# Patient Record
Sex: Female | Born: 1935 | Race: White | Hispanic: No
Health system: Southern US, Community
[De-identification: ages and names within clinical notes are randomized; demographics above are authoritative.]

---

## 2005-04-12 ENCOUNTER — Ambulatory Visit: Payer: Self-pay | Admitting: Family Medicine

## 2005-05-18 ENCOUNTER — Ambulatory Visit: Payer: Self-pay | Admitting: Pain Medicine

## 2005-06-09 ENCOUNTER — Encounter: Payer: Self-pay | Admitting: Pain Medicine

## 2005-06-13 ENCOUNTER — Ambulatory Visit: Payer: Self-pay | Admitting: Pain Medicine

## 2005-07-01 ENCOUNTER — Encounter: Payer: Self-pay | Admitting: Pain Medicine

## 2005-08-25 ENCOUNTER — Ambulatory Visit: Payer: Self-pay | Admitting: Pain Medicine

## 2005-09-07 ENCOUNTER — Ambulatory Visit: Payer: Self-pay | Admitting: Pain Medicine

## 2005-10-27 ENCOUNTER — Ambulatory Visit: Payer: Self-pay | Admitting: Pain Medicine

## 2005-12-07 ENCOUNTER — Ambulatory Visit: Payer: Self-pay | Admitting: Pain Medicine

## 2006-01-10 ENCOUNTER — Ambulatory Visit: Payer: Self-pay | Admitting: Pain Medicine

## 2006-03-14 ENCOUNTER — Ambulatory Visit: Payer: Self-pay | Admitting: Pain Medicine

## 2006-04-17 ENCOUNTER — Ambulatory Visit: Payer: Self-pay | Admitting: Family Medicine

## 2006-09-18 ENCOUNTER — Ambulatory Visit: Payer: Self-pay | Admitting: Pain Medicine

## 2006-10-10 ENCOUNTER — Ambulatory Visit: Payer: Self-pay | Admitting: Pain Medicine

## 2006-10-18 ENCOUNTER — Ambulatory Visit: Payer: Self-pay | Admitting: Pain Medicine

## 2006-12-05 ENCOUNTER — Ambulatory Visit: Payer: Self-pay | Admitting: Pain Medicine

## 2006-12-18 ENCOUNTER — Ambulatory Visit: Payer: Self-pay | Admitting: Pain Medicine

## 2007-04-24 ENCOUNTER — Ambulatory Visit: Payer: Self-pay | Admitting: Pain Medicine

## 2007-05-09 ENCOUNTER — Ambulatory Visit: Payer: Self-pay | Admitting: Pain Medicine

## 2007-07-26 ENCOUNTER — Ambulatory Visit: Payer: Self-pay | Admitting: Pain Medicine

## 2007-08-20 ENCOUNTER — Ambulatory Visit: Payer: Self-pay | Admitting: Pain Medicine

## 2007-10-29 ENCOUNTER — Encounter: Payer: Self-pay | Admitting: Unknown Physician Specialty

## 2007-11-01 ENCOUNTER — Encounter: Payer: Self-pay | Admitting: Unknown Physician Specialty

## 2007-12-02 ENCOUNTER — Encounter: Payer: Self-pay | Admitting: Unknown Physician Specialty

## 2007-12-30 ENCOUNTER — Encounter: Payer: Self-pay | Admitting: Unknown Physician Specialty

## 2008-01-30 ENCOUNTER — Encounter: Payer: Self-pay | Admitting: Unknown Physician Specialty

## 2008-02-28 ENCOUNTER — Ambulatory Visit: Payer: Self-pay | Admitting: Unknown Physician Specialty

## 2008-03-18 ENCOUNTER — Other Ambulatory Visit: Payer: Self-pay

## 2008-03-18 ENCOUNTER — Inpatient Hospital Stay: Payer: Self-pay | Admitting: Internal Medicine

## 2008-03-22 ENCOUNTER — Encounter: Payer: Self-pay | Admitting: Internal Medicine

## 2008-03-31 ENCOUNTER — Encounter: Payer: Self-pay | Admitting: Internal Medicine

## 2008-04-30 ENCOUNTER — Encounter: Payer: Self-pay | Admitting: Internal Medicine

## 2011-07-27 ENCOUNTER — Ambulatory Visit: Payer: Self-pay | Admitting: Family Medicine

## 2011-11-15 ENCOUNTER — Encounter: Payer: Self-pay | Admitting: Nurse Practitioner

## 2011-11-15 ENCOUNTER — Encounter: Payer: Self-pay | Admitting: Cardiothoracic Surgery

## 2011-12-02 ENCOUNTER — Ambulatory Visit: Payer: Self-pay | Admitting: Internal Medicine

## 2011-12-02 ENCOUNTER — Encounter: Payer: Self-pay | Admitting: Nurse Practitioner

## 2011-12-02 ENCOUNTER — Encounter: Payer: Self-pay | Admitting: Cardiothoracic Surgery

## 2011-12-06 ENCOUNTER — Inpatient Hospital Stay: Payer: Self-pay | Admitting: Internal Medicine

## 2011-12-06 LAB — COMPREHENSIVE METABOLIC PANEL
Albumin: 2.4 g/dL — ABNORMAL LOW (ref 3.4–5.0)
Alkaline Phosphatase: 107 U/L (ref 50–136)
Anion Gap: 12 (ref 7–16)
BUN: 37 mg/dL — ABNORMAL HIGH (ref 7–18)
Bilirubin,Total: 0.6 mg/dL (ref 0.2–1.0)
Glucose: 82 mg/dL (ref 65–99)
Potassium: 5.1 mmol/L (ref 3.5–5.1)
SGOT(AST): 45 U/L — ABNORMAL HIGH (ref 15–37)
SGPT (ALT): 7 U/L — ABNORMAL LOW
Sodium: 135 mmol/L — ABNORMAL LOW (ref 136–145)
Total Protein: 6.5 g/dL (ref 6.4–8.2)

## 2011-12-06 LAB — URINALYSIS, COMPLETE
Bilirubin,UR: NEGATIVE
Hyaline Cast: 8
Nitrite: NEGATIVE
Ph: 5 (ref 4.5–8.0)
Protein: NEGATIVE
RBC,UR: 123 /HPF (ref 0–5)
Squamous Epithelial: 8
Transitional Epi: <1

## 2011-12-06 LAB — CBC
HCT: 38.6 % (ref 35.0–47.0)
HGB: 12.6 g/dL (ref 12.0–16.0)
MCV: 96 fL (ref 80–100)
RBC: 4.03 10*6/uL (ref 3.80–5.20)
RDW: 18.1 % — ABNORMAL HIGH (ref 11.5–14.5)
WBC: 18.5 10*3/uL — ABNORMAL HIGH (ref 3.6–11.0)

## 2011-12-07 LAB — CBC WITH DIFFERENTIAL/PLATELET
Basophil #: 0 10*3/uL (ref 0.0–0.1)
Eosinophil #: 0.1 10*3/uL (ref 0.0–0.7)
Eosinophil %: 0.6 %
Lymphocyte %: 8.6 %
MCH: 31.5 pg (ref 26.0–34.0)
MCHC: 32.6 g/dL (ref 32.0–36.0)
Monocyte #: 0.5 10*3/uL (ref 0.0–0.7)
Neutrophil %: 85.4 %
Platelet: 188 10*3/uL (ref 150–440)
RBC: 3.25 10*6/uL — ABNORMAL LOW (ref 3.80–5.20)
RDW: 18.2 % — ABNORMAL HIGH (ref 11.5–14.5)

## 2011-12-07 LAB — BASIC METABOLIC PANEL
BUN: 28 mg/dL — ABNORMAL HIGH (ref 7–18)
Calcium, Total: 7.5 mg/dL — ABNORMAL LOW (ref 8.5–10.1)
Chloride: 105 mmol/L (ref 98–107)
Creatinine: 1.32 mg/dL — ABNORMAL HIGH (ref 0.60–1.30)
EGFR (African American): 50 — ABNORMAL LOW
EGFR (Non-African Amer.): 41 — ABNORMAL LOW
Glucose: 71 mg/dL (ref 65–99)
Osmolality: 278 (ref 275–301)
Potassium: 4.6 mmol/L (ref 3.5–5.1)

## 2011-12-07 LAB — MAGNESIUM: Magnesium: 2.1 mg/dL

## 2011-12-08 LAB — CBC WITH DIFFERENTIAL/PLATELET
Basophil #: 0 10*3/uL (ref 0.0–0.1)
Eosinophil #: 0.1 10*3/uL (ref 0.0–0.7)
Lymphocyte #: 1.1 10*3/uL (ref 1.0–3.6)
Lymphocyte %: 14.4 %
MCHC: 32.5 g/dL (ref 32.0–36.0)
Monocyte %: 5.5 %
Neutrophil %: 78.2 %
RBC: 3.14 10*6/uL — ABNORMAL LOW (ref 3.80–5.20)
RDW: 18 % — ABNORMAL HIGH (ref 11.5–14.5)
WBC: 7.8 10*3/uL (ref 3.6–11.0)

## 2011-12-08 LAB — CREATININE, SERUM
Creatinine: 1.29 mg/dL (ref 0.60–1.30)
EGFR (African American): 52 — ABNORMAL LOW

## 2011-12-09 LAB — CBC WITH DIFFERENTIAL/PLATELET
Eosinophil %: 1.3 %
HCT: 31.2 % — ABNORMAL LOW (ref 35.0–47.0)
Lymphocyte %: 13.4 %
MCH: 31.1 pg (ref 26.0–34.0)
Monocyte #: 0.6 10*3/uL (ref 0.0–0.7)
Monocyte %: 6.3 %
Neutrophil #: 6.8 10*3/uL — ABNORMAL HIGH (ref 1.4–6.5)
Neutrophil %: 78.8 %
Platelet: 197 10*3/uL (ref 150–440)
RBC: 3.23 10*6/uL — ABNORMAL LOW (ref 3.80–5.20)

## 2011-12-09 LAB — BASIC METABOLIC PANEL
Anion Gap: 10 (ref 7–16)
BUN: 13 mg/dL (ref 7–18)
EGFR (Non-African Amer.): 58 — ABNORMAL LOW
Glucose: 114 mg/dL — ABNORMAL HIGH (ref 65–99)
Osmolality: 280 (ref 275–301)
Potassium: 3.9 mmol/L (ref 3.5–5.1)

## 2011-12-12 LAB — CREATININE, SERUM
Creatinine: 0.82 mg/dL (ref 0.60–1.30)
EGFR (African American): >60
EGFR (Non-African Amer.): >60

## 2011-12-12 LAB — CULTURE, BLOOD (SINGLE)

## 2011-12-30 ENCOUNTER — Encounter: Payer: Self-pay | Admitting: Cardiothoracic Surgery

## 2011-12-30 ENCOUNTER — Encounter: Payer: Self-pay | Admitting: Nurse Practitioner

## 2011-12-30 ENCOUNTER — Ambulatory Visit: Payer: Self-pay | Admitting: Internal Medicine

## 2012-01-30 ENCOUNTER — Encounter: Payer: Self-pay | Admitting: Cardiothoracic Surgery

## 2012-04-20 ENCOUNTER — Ambulatory Visit: Payer: Self-pay | Admitting: Neurology

## 2012-04-30 ENCOUNTER — Ambulatory Visit: Payer: Self-pay | Admitting: Internal Medicine

## 2012-05-18 ENCOUNTER — Inpatient Hospital Stay: Payer: Self-pay | Admitting: Internal Medicine

## 2012-05-18 LAB — COMPREHENSIVE METABOLIC PANEL
Albumin: 2.2 g/dL — ABNORMAL LOW (ref 3.4–5.0)
Alkaline Phosphatase: 129 U/L (ref 50–136)
Anion Gap: 11 (ref 7–16)
BUN: 57 mg/dL — ABNORMAL HIGH (ref 7–18)
Chloride: 110 mmol/L — ABNORMAL HIGH (ref 98–107)
Creatinine: 1.35 mg/dL — ABNORMAL HIGH (ref 0.60–1.30)
Glucose: 95 mg/dL (ref 65–99)
Osmolality: 302 (ref 275–301)
Potassium: 4.1 mmol/L (ref 3.5–5.1)
SGOT(AST): 19 U/L (ref 15–37)
Sodium: 144 mmol/L (ref 136–145)
Total Protein: 6.9 g/dL (ref 6.4–8.2)

## 2012-05-18 LAB — CK TOTAL AND CKMB (NOT AT ARMC)
CK, Total: 25 U/L (ref 21–215)
CK-MB: <0.5 ng/mL — ABNORMAL LOW (ref 0.5–3.6)

## 2012-05-18 LAB — URINALYSIS, COMPLETE
Bilirubin,UR: NEGATIVE
Hyaline Cast: 102
Nitrite: NEGATIVE
Ph: 5 (ref 4.5–8.0)
Protein: 100
Specific Gravity: 1.023 (ref 1.003–1.030)

## 2012-05-18 LAB — CBC
MCHC: 30.4 g/dL — ABNORMAL LOW (ref 32.0–36.0)
Platelet: 138 10*3/uL — ABNORMAL LOW (ref 150–440)
RBC: 3.7 10*6/uL — ABNORMAL LOW (ref 3.80–5.20)
WBC: 20.9 10*3/uL — ABNORMAL HIGH (ref 3.6–11.0)

## 2012-05-18 LAB — PRO B NATRIURETIC PEPTIDE: B-Type Natriuretic Peptide: 5000 pg/mL — ABNORMAL HIGH (ref 0–450)

## 2012-05-18 LAB — TROPONIN I: Troponin-I: 0.08 ng/mL — ABNORMAL HIGH

## 2012-05-19 LAB — CBC WITH DIFFERENTIAL/PLATELET
Basophil #: 0 10*3/uL (ref 0.0–0.1)
Eosinophil #: 0 10*3/uL (ref 0.0–0.7)
Eosinophil %: 0 %
HGB: 10 g/dL — ABNORMAL LOW (ref 12.0–16.0)
Lymphocyte #: 0.7 10*3/uL — ABNORMAL LOW (ref 1.0–3.6)
MCHC: 30.1 g/dL — ABNORMAL LOW (ref 32.0–36.0)
MCV: 87 fL (ref 80–100)
Monocyte #: 0.1 x10 3/mm — ABNORMAL LOW (ref 0.2–0.9)
Monocyte %: 0.7 %
Neutrophil %: 95 %
Platelet: 114 10*3/uL — ABNORMAL LOW (ref 150–440)
RBC: 3.81 10*6/uL (ref 3.80–5.20)
RDW: 23 % — ABNORMAL HIGH (ref 11.5–14.5)

## 2012-05-19 LAB — COMPREHENSIVE METABOLIC PANEL
Alkaline Phosphatase: 103 U/L (ref 50–136)
Anion Gap: 13 (ref 7–16)
Calcium, Total: 7.7 mg/dL — ABNORMAL LOW (ref 8.5–10.1)
Chloride: 113 mmol/L — ABNORMAL HIGH (ref 98–107)
Co2: 22 mmol/L (ref 21–32)
Creatinine: 0.97 mg/dL (ref 0.60–1.30)
EGFR (Non-African Amer.): 56 — ABNORMAL LOW
Potassium: 3 mmol/L — ABNORMAL LOW (ref 3.5–5.1)
SGOT(AST): 13 U/L — ABNORMAL LOW (ref 15–37)
SGPT (ALT): <6 U/L — ABNORMAL LOW

## 2012-05-19 LAB — TROPONIN I: Troponin-I: <0.02 ng/mL

## 2012-05-19 LAB — MAGNESIUM: Magnesium: 2.4 mg/dL

## 2012-05-21 LAB — URINE CULTURE

## 2012-05-24 LAB — CULTURE, BLOOD (SINGLE)

## 2012-05-31 ENCOUNTER — Ambulatory Visit: Payer: Self-pay | Admitting: Internal Medicine

## 2012-07-01 DEATH — deceased

## 2014-06-05 IMAGING — CT CT HEAD WITHOUT CONTRAST
4 of 9 series · 8 of 30 positions shown, 9 images · non-contrast
Comparison: none

REASON FOR EXAM: advanced dementia
COMMENTS:

[Series 6: soft tissue · axial · 0.43mm/px · z∈[-49,+2]mm · 2 of 34 slices shown, 3 images (1 of 2)]
[im 12/34  brain]
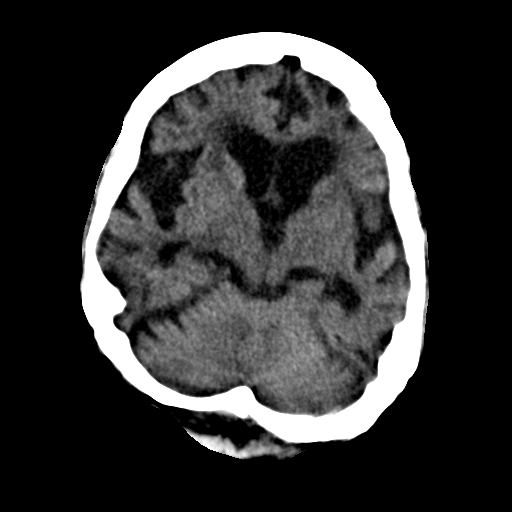
[im 12/34  bone]
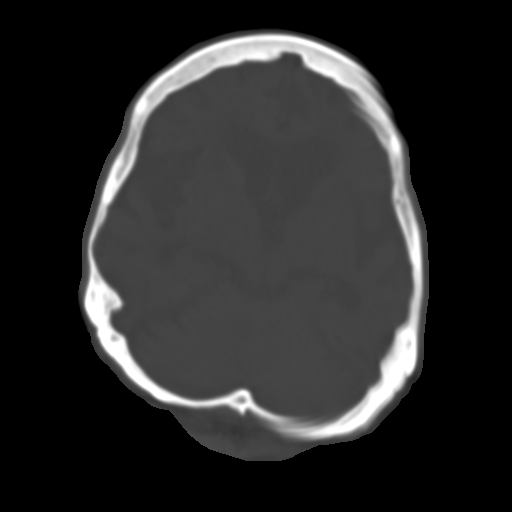
[im 23/34  brain]
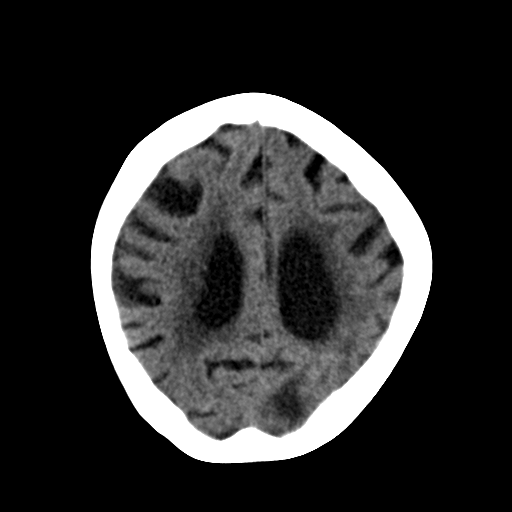

[Series 7: bone · axial · 0.43mm/px · z∈[-46,+6]mm · 2 of 34 slices shown (1 of 2)]
[im 12/34  bone]
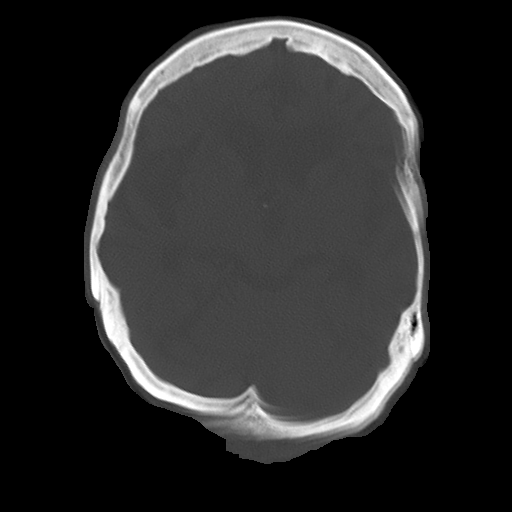
[im 23/34  bone]
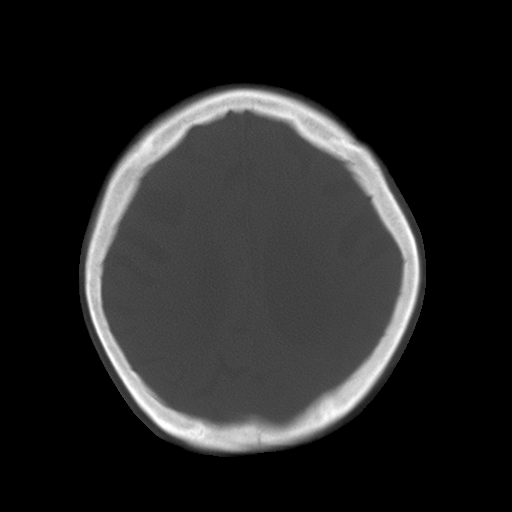

[Series 10: soft tissue · axial · 0.39mm/px · z∈[+8,+58]mm · 2 of 32 slices shown (2 of 2)]
[im 11/32  brain]
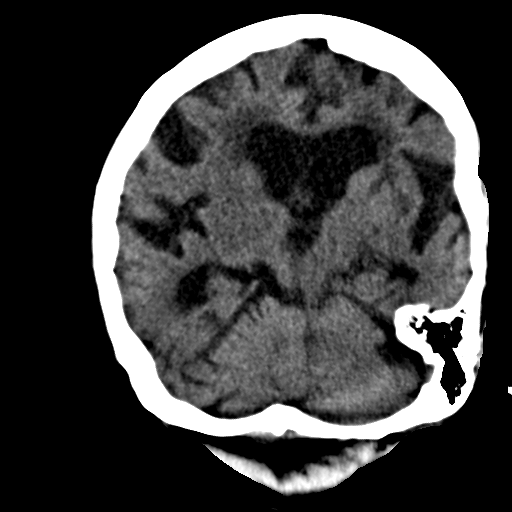
[im 21/32  brain]
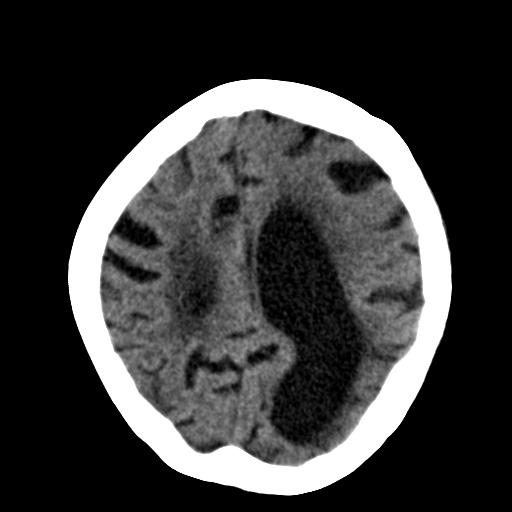

[Series 11: bone · axial · 0.39mm/px · z∈[+9,+59]mm · 2 of 32 slices shown (2 of 2)]
[im 11/32  bone]
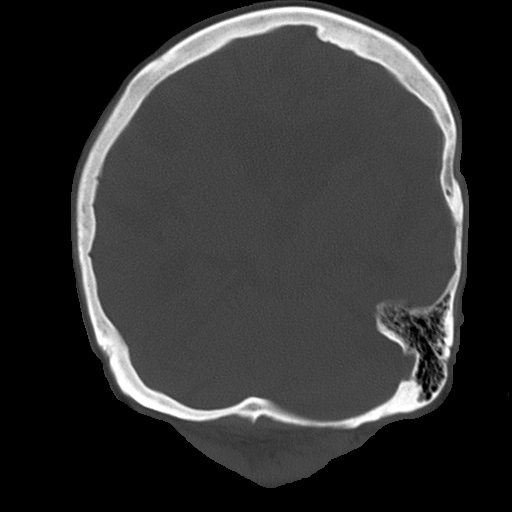
[im 21/32  bone]
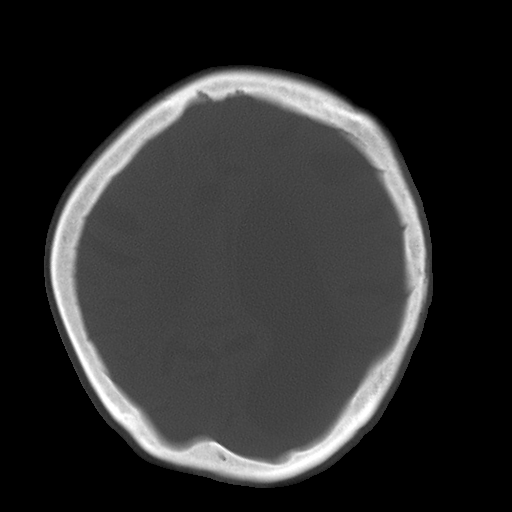

[8 of 30 positions shown; findings below may reference images not displayed]

PROCEDURE:     CT  - CT HEAD WITHOUT CONTRAST  - April 20, 2012  [DATE]

RESULT:     Images through the brain were acquired with reconstructions
performed to bring the patient into near-anatomic position.

There is diffuse cerebral and cerebellar atrophy with moderate compensatory
ventriculomegaly. There is no shift of the midline. There is no acute
intracranial hemorrhage. There are no findings to suggest an evolving
ischemic event. Decreased density in the deep white matter of both cerebral
hemispheres is consistent with chronic small vessel ischemia. At bone window
settings I do not see evidence of an acute skull fracture.
IMPRESSION: There are extensive chronic changes consistent with atrophy
and ventriculomegaly and chronic small vessel ischemia. There is no acute
ischemic or hemorrhagic event nor intracranial mass effect.

## 2014-07-03 IMAGING — CR DG CHEST 1V PORT
1 series · 1 of 1 positions shown · non-contrast
Comparison: none

REASON FOR EXAM: Shortness of Breath
COMMENTS:

[portable]
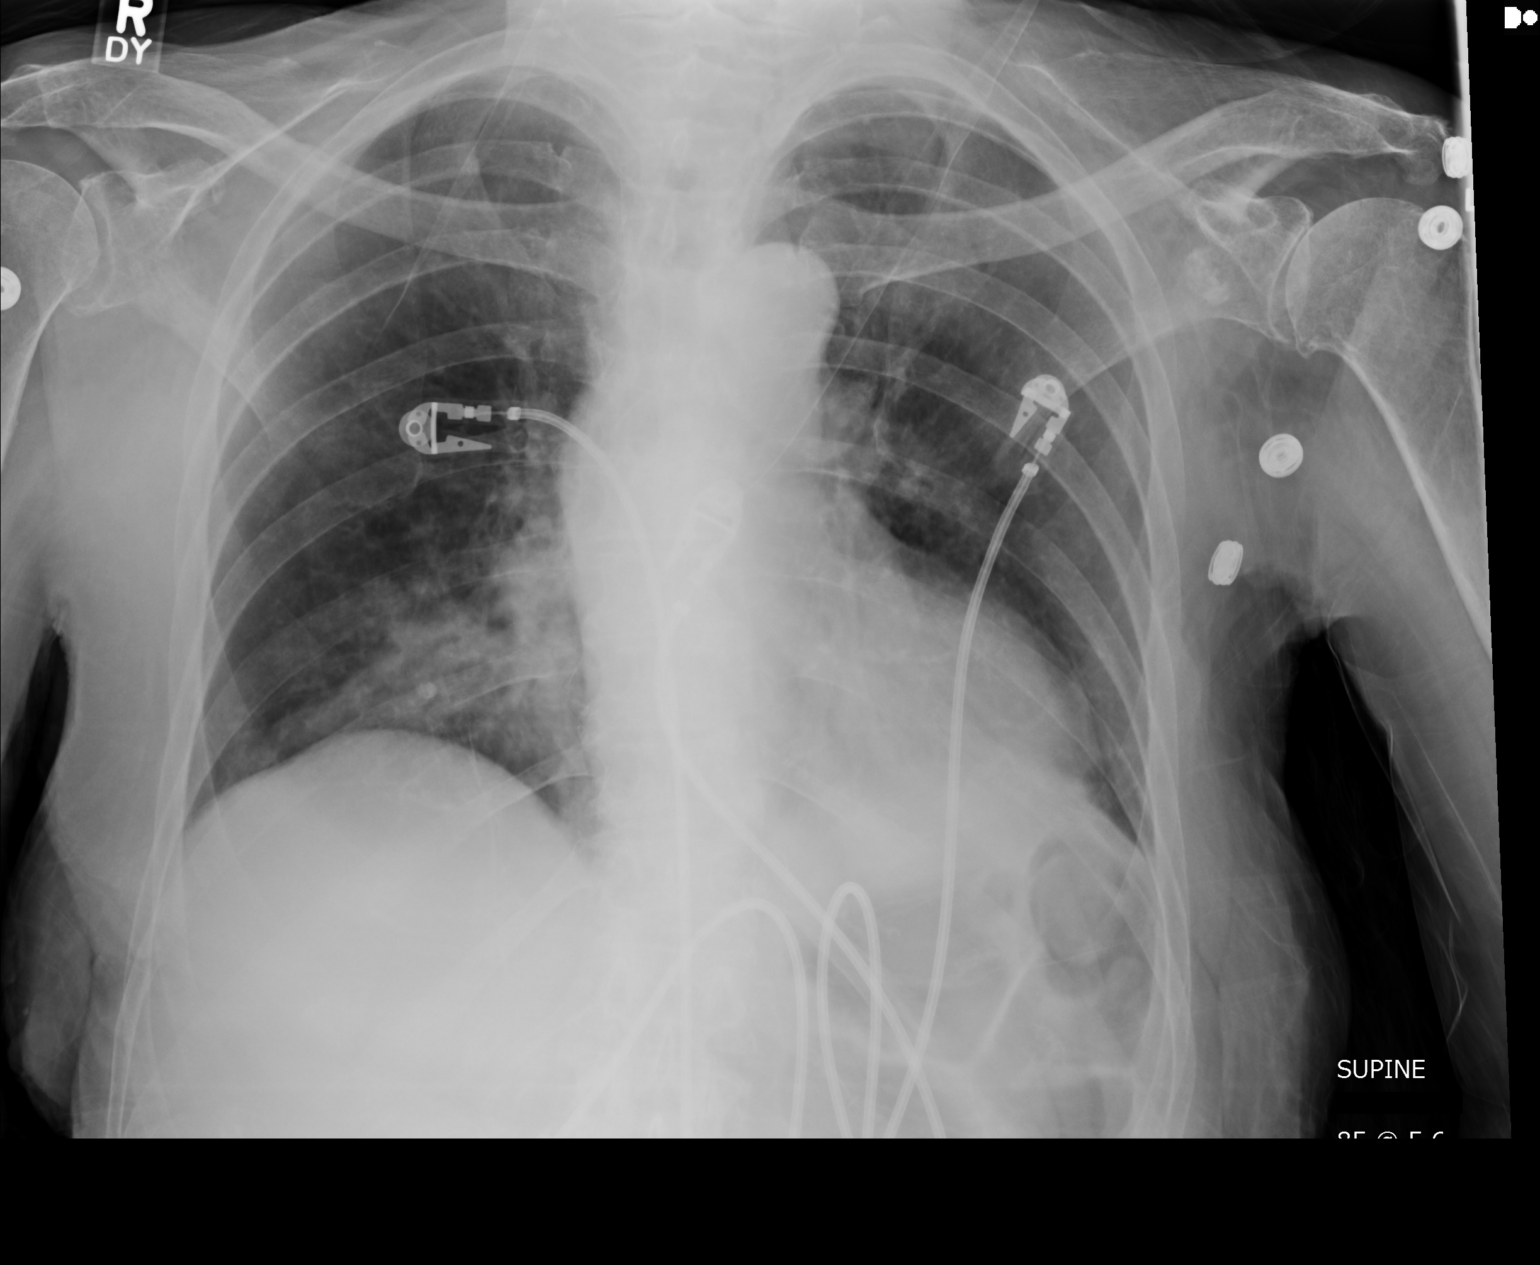

[1 of 1 positions shown; findings below may reference images not displayed]

PROCEDURE:     DXR - DXR PORTABLE CHEST SINGLE VIEW  - May 18, 2012  [DATE]

RESULT:     Comparison is made to study 06 December, 2011.

There is a new infiltrate in the right infrahilar region. The left
hemidiaphragm remains partially obscured. The cardiac silhouette is not
enlarged. The pulmonary vascularity is not engorged.
IMPRESSION: The findings suggest right lower lobe pneumonia. A followup
PA and lateral chest x-ray would be of value.

## 2015-02-17 NOTE — Discharge Summary (Signed)
PATIENT NAME:  Dawn Long, Dawn Long MR#:  [id number] DATE OF BIRTH:  01-18-1932  DATE OF ADMISSION:  05/18/2012 DATE OF DISCHARGE:  05/21/2012  PRIMARY CARE PHYSICIAN: Karrie DoffingWilliam Selvidge, MD  DISCHARGE DIAGNOSES:  1. Aspiration pneumonia.  2. Culture-negative urinary tract infection.  3. Sepsis. 4. Acute respiratory failure.  5. Acute encephalopathy. 6. Dysphagia. 7. Elevated troponin.  8. Atrial fibrillation with rapid ventricular rate.  9. Oral thrush.  10. Multiple decubitus ulcers present on admission.   IMAGING STUDIES: Chest x-ray showed right lower lobe pneumonia.   CONSULTANTS: Ned GraceNancy Phifer, MD - Palliative Care.   ADMITTING HISTORY AND PHYSICAL: Please see detailed history and physical dictated on 05/18/2012. In brief, this is a 81 year old female patient with advanced dementia, bedridden with multiple decubitus ulcers and recurrent UTIs at home on hospice care who presented to the Emergency Room for shortness of breath, brought in by her daughter. The patient was DNR/DNI, but the daughter did want aggressive treatment of the infection in spite of being on hospice and was admitted to the hospitalist service.   HOSPITAL COURSE: The patient was started on antibiotics of vancomycin and Zosyn along with azithromycin for coverage of her recurrent urinary tract infections and aspiration pneumonia with sepsis and acute respiratory failure. The patient responded poorly to antibiotic treatment, IV fluids, and aggressive management. Dr. Harvie JuniorPhifer of palliative care was consulted who suggested returning home on hospice, but the patient's daughter continued to want aggressive treatment. Later with multiple comorbidities, the patient did poorly during the hospital and was awake, in the patient's own words wanted to spend time with her family and go home, and at that time the decision was made by her daughter to take the patient home on hospice without any aggressive measures. At this point, the patient's  antibiotics were stopped. The patient was started on pain and anxiety medications along with atropine drops to decrease her secretions and is being discharged home with hospice.   This plan was discussed with the patient and her daughter who verbalized understanding and are okay with the plan. Today the patient is awake, needing 3 liters of oxygen to keep her saturations at 93%, is afebrile, lungs have coarse wheezing, and is being discharged home on hospice.   DISCHARGE MEDICATIONS:  1. Nystatin 100,000 unit topical application twice a day.  2. Metronidazole 250 mg oral tablet 1-1/2 tablets apply to wounds.  3. Acetaminophen 650 mg rectal suppository four times a day as needed.  4. Lorazepam 2 mg/mL 1 mL every six hours as needed for anxiety.  5. Oxycodone 20 mg/mL oral concentrate 0.5 mL every six hours as needed for pain.  6. Atropine 1% ophthalmic solution two drops oral every four hours as needed for secretions.   DISCHARGE INSTRUCTIONS: The patient will be discharged home on hospice. Continue medications as prescribed. The patient is at high risk for recurrent pneumonias and infections and UTIs and this was explained to the daughter. Further management as per hospice team.   TIME SPENT: Time spent today on this discharge dictation along with coordinating care and counseling of the patient was 35 minutes. ____________________________ Molinda BailiffSrikar R. Darriona Dehaas, MD srs:slb D: 05/21/2012 16:12:54 ET T: 05/22/2012 12:43:06 ET JOB#: [id number]  cc: Wardell HeathSrikar R. Elpidio AnisSudini, MD, <Dictator> Karrie DoffingWilliam Selvidge, MD Orie FishermanSRIKAR R Makyah Lavigne MD ELECTRONICALLY SIGNED 05/30/2012 13:51

## 2015-02-17 NOTE — H&P (Signed)
PATIENT NAME:  Dawn Long, Dawn Long MR#:  [id number] DATE OF BIRTH:  18-Jun-1932  DATE OF ADMISSION:  05/18/2012  REFERRING PHYSICIAN: Dr. Mindi JunkerGottlieb  PRIMARY CARE PHYSICIAN: Dr. Karrie DoffingWilliam Selvidge   CHIEF COMPLAINT: Shortness of breath.   HISTORY OF PRESENT ILLNESS: This is a 81 year old female with advanced dementia, bedridden, multiple decubitus ulcer over the last year, lives with her daughter at home on hospice care presents for shortness of breath. Daughter reports that her mother appeared to have difficulty breathing which prompted her to bring her to the Emergency Department. As well daughter reports patient has not been eating or drinking well for some time now and mainly over the last few days as well and she reported she had difficulty swallowing as well secondary to oral thrush. In ED patient was found to be hypoxic requiring 100% rebreather bag to maintain saturation in the mid 90s. Had chest x-ray done which did show right lung infiltrate. Daughter reports patient upon eating has been coughing. Patient had significant leukocytosis of 20,000 and was found to be in atrial fibrillation with RVR upon presentation to ED.   As well patient is complaining of recurrent urinary tract infection, has chronic indwelling Foley, has been treated previously with amoxicillin without much improvement where she was recently switched to Bactrim. As well patient has been having multiple decubitus ulcers where she has been followed at home with home care for that and with wound clinic as well.   CODE STATUS: DO NOT RESUSCITATE, DO NOT INTUBATE. Discussed at length with the daughter about goals of care and she reports patient has a living will which explains her wishes not to be kept alive on any artificial means. Patient has expressed previously her refusal for any form of tube feed.   Patient is demented and most of the history was obtained from daughter.   PAST MEDICAL HISTORY:  1. 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   2. Fibromyalgia.  3. Chronic lower back pain.  4. History of severe arthritis.  5. Decubitus ulcers, multiple, in different stages.  6. Hypertension.  7. Migraine headache.  8. History of right knee replacement.   ALLERGIES: Amitriptyline.   HOME MEDICATIONS:  1. Clotrimazole 10 mg oral lozenges 5 times a day for seven days.  2. Metronidazole 250 mg oral 1-1/2 tablets applied to wound.  3. Nystatin topical powder apply to affected area twice a day.  4. Bactrim double strength oral 2 times a day for a total seven days.   REVIEW OF SYSTEMS: Unobtainable as the patient has significant dementia and does not talk much.   FAMILY HISTORY: Positive for hypertension.   SOCIAL HISTORY: Patient lives with her daughter who takes care of her. Nonsmoker. Nondrinker. 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   PHYSICAL EXAMINATION:  VITAL SIGNS: Temperature 100, pulse 110, respiratory rate 20, blood pressure 113/71, saturating 90% on nonrebreather bag.   GENERAL: Frail, elderly female looks comfortable in bed in no apparent distress.   HEENT: Head atraumatic, normocephalic. Dry oral mucosa. Has poor oral hygiene with minimal thrush.   NECK: Supple. No thyromegaly. No JVD. No bruits.   CHEST: Patient had fair air entry bilaterally with right lung rales.   CARDIOVASCULAR: S1, S2 heard. Irregular, irregular tachycardia. No rubs, gallops.   ABDOMEN: Soft, nontender, nondistended.   EXTREMITIES: No edema.   SKIN: Patient has multiple pressure ulcers mainly in the left elbow area and significant stage IV sacral ulcer which is packed, does not  appear to be toxic. As well right trochanter area and right gluteal area and bilateral heel ulcers. As well in her back appears to be multiple abrasions with pressure ulcers.   PSYCHIATRIC: Unable to evaluate as patient has advanced dementia.   NEUROLOGIC: Unable to evaluate as well, secondary to advanced dementia.    LABORATORY, DIAGNOSTIC AND RADIOLOGICAL DATA: ProBNP 5000, glucose 95, BUN 57, creatinine 1.35, sodium 144, potassium 4.1, chloride 110, CO2 23, troponin 0.08. White blood cells 20.9, hemoglobin 9.7, platelets 138, hematocrit 32. Chest x-ray showing right lower lung infiltrate.   EKG is showing atrial fibrillation with RVR heart rate of 161.   ASSESSMENT AND PLAN:  1. This is a 81 year old female with multiple medical problems on home hospice presents with acute respiratory failure secondary to pneumonia, appears to be most likely aspiration pneumonia secondary to dysphagia from patient's dementia.  2. Acute respiratory failure. This is secondary to pneumonia. Will continue on nonrebreather bag and will titrate oxygen gradually if improves. Patient is DO NOT RESUSCITATE/DO NOT INTUBATE.  3. Pneumonia, right lower lung pneumonia. This is most likely aspiration pneumonia secondary to patient's dysphagia. Patient will be kept n.p.o. Will be on broad-spectrum antibiotic, IV vancomycin and Zosyn. Blood cultures will be sent.  4. Dysphagia. Patient appears to have dysphagia plus odynophagia from oral thrush and her advanced dementia. Will have swallow service evaluate the patient, meanwhile she will be kept n.p.o.  5. Borderline troponin. Patient has mildly elevated troponin.  This is most likely related to her sepsis and acute renal failure and atrial fibrillation with RVR. Will repeat troponin in a.Long.  6. Atrial fibrillation with RVR. Patient's blood pressure is borderline, unable to tolerate any p.o. intake. Will have her on p.r.n. metoprolol.  7. Oral thrush secondary to patient's inability to tolerate any p.o. intake. Will have her on IV Diflucan x3 days and oral hygiene every two hours.  8. Chronic recurrent urinary tract infection. Patient has chronic indwelling Foley. Will continue with broad-spectrum IV antibiotics till blood culture and urine culture results are available.  9. Pressure ulcers.  Appears to be nontoxic. Will consult wound care.  10. Had lengthy discussion with daughter about goals of care and will consult palliative care consult to discuss end of life issues and goals of care with this patient. Overall has poor quality of life.  11. CODE STATUS: DO NOT RESUSCITATE/DO NOT INTUBATE.   TOTAL TIME SPENT ON PATIENT CARE: 60 minutes.   ____________________________ Starleen Arms, MD dse:cms D: 05/18/2012 15:33:29 ET T: 05/18/2012 16:02:57 ET JOB#: [id number]  cc: Starleen Arms, MD, <Dictator> Karrie Doffing, MD Joeanna Howdyshell Teena Irani MD ELECTRONICALLY SIGNED 05/19/2012 22:14

## 2015-02-22 NOTE — Consult Note (Signed)
PATIENT NAME:  Dawn Long, Fatime M MR#:  [id number] DATE OF BIRTH:  1932-04-27  DATE OF CONSULTATION:  12/06/2011  REFERRING PHYSICIAN:   CONSULTING PHYSICIAN:  Adah Salvageichard E. Excell Seltzerooper, MD  CHIEF COMPLAINT: Decubitus ulcer.   HISTORY OF PRESENT ILLNESS: I was called by Dr. Allena KatzPatel who I spoke to personally concerning this patient's decubitus. She is being admitted to the hospital with likely sepsis and I was asked to evaluate a nonhealing sacral decubitus ulcer and possibly a heel ulcer. She is being seen in the [facility]ound Care Clinic. This has been going on for several months and seems to be worsening in spite of aggressive therapy to prevent this.   The patient apparently has Parkinson's dementia and is severely debilitated. She has been bedbound and wheelchair bedbound now for several months.   PAST MEDICAL HISTORY:  1. Parkinson's dementia. 2. Fibromyalgia.  3. Arthritis.  4. Decubitus ulcer. 5. Right heel ulcer. 6. Hypertension.   PAST SURGICAL HISTORY: Knee replacement.   MEDICATIONS: Multiple, see chart.   FAMILY HISTORY: Noncontributory.   REVIEW OF SYSTEMS: Not obtainable.   PHYSICAL EXAMINATION:   GENERAL: Chronically ill, debilitated, and noncommunicative patient. She is seen in the Emergency Room.   VITAL SIGNS: No temperature taken in the Emergency Room but her pulse is 86, blood pressure 71/48, 86% room air sats.   HEENT: Poor dentition. No scleral icterus.   NECK: No palpable neck nodes.   CHEST: Bilateral rhonchi.   CARDIAC: Regular rate and rhythm.   ABDOMEN: Soft, nontender.   EXTREMITIES: Minimal edema. Lower extremities are wrapped in a heel protecting boot and the right heel ulcer is not examined.   The patient is rolled to inspect the sacral decubitus ulcer. There is a cephalad opening measuring approximately 4 x 4 cm. There is minimal to no erythema, no foul odor, and no drainage but the undermining extends well down to the sacrum and below.   LABORATORY,  DIAGNOSTIC, AND RADIOLOGICAL DATA: White blood cell count 18.5. Electrolytes show creatinine of 1.74, serum sodium of 135, calcium of 8.4, albumin of 2.4, total protein of 6.5. Urinalysis shows 3+ blood, leukocyte esterase 2+, RBCs and white blood cells both over 100 per high-power field with 2+ bacteria.   ASSESSMENT AND PLAN: This is a patient with a sacral decubitus and a right heel ulcer. This sacral decubitus needs to be examined under anesthesia and likely unroofed and debrided and at the same time an exam under anesthesia and possible debridement of the heel ulcer could be performed as well. However, the patient's blood pressure is significantly low. She likely has a urinary tract infection and is being treated with IV antibiotics. I do not believe that the source of her infection is the decubitus ulcer in that there is no foul odor and there is no expressible drainage but it certainly will not heal without further debridement and cleaning up this area.        My plan would be to allow Dr. Eliane DecreePatel's team to treat this patient with IV antibiotics. Once her blood pressure is sustainable and an operating room visit under general anesthesia could be made safely, then we could debride the sacral decubitus and possibly the heel ulcer. This was discussed with the daughter and she is in agreement.   ____________________________ Adah Salvageichard E. Excell Seltzerooper, MD rec:drc D: 12/07/2011 08:58:01 ET T: 12/07/2011 09:47:55 ET JOB#: [id number]  cc: Adah Salvageichard E. Excell Seltzerooper, MD, <Dictator> Lattie HawICHARD E Kaliegh Willadsen MD ELECTRONICALLY SIGNED 12/08/2011 13:38

## 2015-02-22 NOTE — H&P (Signed)
Long NAME:  Dawn Long, Dawn Long MR#:  [id number] DATE OF BIRTH:  1931-11-23  DATE OF ADMISSION:  12/06/2011  PRIMARY CARE PHYSICIAN: Karrie DoffingWilliam Selvidge, MD  CHIEF COMPLAINT: Severe decubitus, poor intake, and lethargy.  HISTORY OF PRESENT ILLNESS: Dawn Long is a pleasant 81 year old Caucasian female who has history of suspected Parkinson's dementia and history of hypertension who is wheelchair bound and bedbound for Dawn last 6 to 8 months, per her daughter, and is progressively going downhill, per her daughter. Dawn Long was found to have developed a decubitus since October 2012 progressively getting worse to Dawn point Dawn Long has severe cellulitis with what appears to be stage IV or V decubitus ulcer. She has a hospital bed, but according to her daughter, her mattress has been regular and they try to turn her at home, however, her wound continued to get worse.  She was evaluated by [facility] and her primary care physician and was asked to come to Dawn Emergency Room. Dawn Long was found to be very hypotensive with blood pressure in Dawn 70s, white count is 18,000, and she appears to be dehydrated clinically. She is being admitted for sepsis, suspected source is her infected decubitus. Dawn Long received IV Zosyn and IV vancomycin in Dawn Emergency Room. Wound cultures have been obtained. Blood cultures have been obtained as well.   PAST MEDICAL HISTORY:  1. Suspected Parkinson's dementia. Dawn Long is already on Sinemet. She has an upcoming new appointment with a neurologist.  2. Fibromyalgia.  3. Chronic low back pain.  4. History of severe arthritis leading her to progressively decline with Dawn Long's daughter telling me Dawn Long has been bedbound and wheelchair bound for Dawn past 6 to 8 months.  5. Decubitus ulcer, appears to be stage IV to V. 6. Right heel non-healing ulcer, again appears to be a pressure sore which is followed by a wound clinic.  7. Hypertension.   8. Migraine headaches.  9. History of right knee replacement.   ALLERGIES: Amitriptyline.   MEDICATIONS:  1. Accupril 40 mg p.o. daily.  2. Bayer aspirin 81 mg daily.  3. Carbidopa/levodopa 25/100 mg  2 tablets three times daily. 4. Econazole nitrate 1% topical cream apply twice a day.  5. Fluconazole 150 mg p.o. daily for three days, started on 12/05/2011 and will end on 12/07/2011.  6. Nystatin one application topically twice a day. 7. Quinapril 40 mg daily.  8. Santyl one application topically once a day to Dawn right heel.  9. Sertraline 100 mg p.o. daily.   REVIEW OF SYSTEMS: Unobtainable. Dawn Long has significant dementia and does not talk much.   FAMILY HISTORY: Positive for hypertension.   PHYSICAL EXAMINATION:   GENERAL: Dawn Long is awake. She does have Parkinson's facies. She appears cachetic, malnourished. She is not in acute distress.   VITALS: Afebrile, pulse 86, respirations 16, blood pressure on arrival was 71/48 and repeat blood pressure after IV fluids 103/72, and saturations are 92% on 2 liters nasal cannula oxygen.   HEENT: Atraumatic, normocephalic. Pupils are equally round and reactive to light and accommodation. Extraocular movements are intact. Oral mucosa is dry. Poor dentition.    NECK: Supple. No JVD. No carotid bruit.   LUNGS: Clear to auscultation bilaterally. No rales, rhonchi, respiratory distress, or labored breathing.   HEART: Both heart sounds are normal. Rate and rhythm is regular. PMI is not lateralized. Chest is nontender.  No murmur heard.  EXTREMITIES: Dawn Long has bilateral heel protectors.  There is a right nonhealing decubitus ulcer. There is no surrounding edema or cellulitis. Pedal pulses are feebly palpable. femoral pulses are well palpable.   SKIN: Dawn Long does have a significant decubitus which appears to be stage IV to V which appears to be about 3 x 3 cm with slough, is necrotic with odor, with surrounding cellulitis. It  visually appears to be tunneling.   NEURO: Dawn Long does have Parkinson's facies. She moves her extremities well. She has subjective weakness.   PSYCH: Dawn Long is awake, she is alert.  LABS/STUDIES: Urinalysis is positive for urinary tract infection.   White count 18.5, hemoglobin and hematocrit 12.6 and 38.6, platelet count 256. Glucose 82, BUN 37, creatinine 1.74, sodium 135, potassium 5.1, chloride 98, SGOT 45, albumin 2.4. Lactic acid 2.2.   Chest x-ray: No acute changes are identified.   ASSESSMENT: 81 year old Dawn Long with:  1. Sepsis, suspected stage IV/V decubitus ulcer with surrounding cellulitis. Dawn Long presented with significant hypotension, elevated white count, and dehydration.  2. Right heel non-healing decubitus, followed by a wound clinic.  3. Suspected Parkinson's.  4. Dysphagia, progressively getting worse per daughter.  5. Adult failure to thrive, appears cachectic and malnourished.   PLAN:  1. Admit Dawn Long to Dawn medicine floor with off-unit telemetry.  2. IV fluids for hydration.  3. Follow blood culture, urine culture, and wound cultures.  4. I will cover Dawn Long with broad-spectrum antibiotics, Zosyn and vancomycin, since Dawn right heel ulcer grew MRSA.  5. We will have surgical consultation with Dr. Excell Seltzer. Dawn case was discussed with him. Dawn Long may need debridement of Dawn decubitus.  6. I will hold off on blood pressure medications.  7. PRN Tylenol and morphine for pain.  8. Care Management for discharge planning.   CODE STATUS was discussed with Dawn Long's daughter, who is her health care power of attorney. Dawn Long is a NO CODE/DO NOT RESUSCITATE.   Further work-up according to Dawn Long's clinical course. Dawn hospital admission plan was discussed with Dawn Long and family members.   CRITICAL TIME SPENT: 50 minutes. ____________________________ Wylie Hail Allena Katz, MD sap:slb D: 12/06/2011 16:42:59 ET T: 12/06/2011  17:25:48 ET JOB#: [id number]  cc: Dontez Hauss A. Allena Katz, MD, <Dictator> Karrie Doffing, MD Willow Ora MD ELECTRONICALLY SIGNED 12/08/2011 7:41

## 2015-02-22 NOTE — Consult Note (Signed)
Brief Consult Note: Diagnosis: sacral decubitus ulcer and rt heel ulcer.   Patient was seen by consultant.   Consult note dictated.   Recommend to proceed with surgery or procedure.   Orders entered.   Discussed with Attending MD.   Comments: Disc with family rec EUA and debride/unroof sacral decub. will also eaxamine the rt heel ulcer and debride if nec.  Electronic Signatures: Lattie Hawooper, Abia Monaco E (MD)  (Signed [phone]-Feb-13 17:02)  Authored: Brief Consult Note   Last Updated: 05-Feb-13 17:02 by Lattie Hawooper, Naviah Belfield E (MD)

## 2015-02-22 NOTE — Op Note (Signed)
PATIENT NAME:  Dawn Long, Dawn Long MR#:  [id number] DATE OF BIRTH:  August 04, 1932  DATE OF PROCEDURE:  12/10/2011  PREOPERATIVE DIAGNOSIS: Sacral decubitus ulcer.   POSTOPERATIVE DIAGNOSIS: Stage III sacral decubitus ulcer on the right.   PROCEDURES PERFORMED:  1. Sharp excisional debridement of skin and soft tissue of sacral decubitus ulcer.  2. Initial application of Wound VAC assisted closure device measuring less than 50 sq cm.  SURGEON: Treyton Slimp A. Egbert GaribaldiBird, MD    ASSISTANT: Surgical scrub technologist   TYPE OF ANESTHESIA: Spinal.   DESCRIPTION OF PROCEDURE: With the patient in the left lateral decubitus position, the existing dressing was removed. Spinal anesthetic had been placed. The wound itself measured approximately 3 x 4 cm in opening and then tunneled a total of 5 cm inferiorly. It was well away from the midline. It was well away from the anal canal. The wound was relatively clean. A small excision of skin was performed on the caudad border just to open this area up and explore the area further. This was done with cautery. Hemostasis being obtained.   The wound was irrigated. A small amount of necrotic tissue was debrided with electrocautery at the base of the wound and discarded.   Silvadene impregnated VAC foam was placed into the wound and tunneled onto the right hip. Seal was obtained. The patient was then returned supine and taken to the recovery room in stable and satisfactory condition by anesthesia services.   ____________________________ Redge GainerMark A. Egbert GaribaldiBird, MD mab:drc D: 12/10/2011 11:41:18 ET T: 12/10/2011 11:52:44 ET JOB#: [id number]  cc: Loraine LericheMark A. Egbert GaribaldiBird, MD, <Dictator> Raynald KempMARK A Jacqualynn Parco MD ELECTRONICALLY SIGNED 12/12/2011 11:29

## 2015-02-22 NOTE — Discharge Summary (Signed)
PATIENT NAME:  Dawn BachSHELTON, Tejal M MR#:  [id number] DATE OF BIRTH:  1931-12-01  DATE OF ADMISSION:  12/06/2011 DATE OF DISCHARGE:  12/12/2011  DISCHARGE DIAGNOSES:  1. Sepsis due to infected sacral decubitus status post debridement and Wound VAC placement.  2. Right heel decubitus ulcer. 3. Dementia. 4. Dysphagia.  5. Failure to thrive. 6. Relative hypotension. 7. Acute renal failure. 8. Anemia of chronic disease.   DISPOSITION: The patient is being discharged home with home health.   DIET: Regular, puree.  ACTIVITY: As tolerated.   DISCHARGE MEDICATIONS:  1. Augmentin 400 mg/5 mL 10 mL p.o. b.i.d. for five days. 2. Nystatin powder apply to affected area b.i.d.  3. Silvadene 1% cream apply to affected area b.i.d.   CODE STATUS: DO NOT RESUSCITATE.  CONSULTATIONS:  1. Surgical consultation with Dr. Egbert GaribaldiBird and Dr. Excell Seltzerooper  2. Palliative Care consultation with Dr. Harvie JuniorPhifer   LABORATORY, DIAGNOSTIC, AND RADIOLOGICAL DATA: Wound culture grew Enterococcus and Enterobacter. Chest x-ray shows no acute abnormalities. Urine and blood cultures have been negative so far. White count 18.5 on admission, normal by the time of discharge. Hemoglobin ranging from 9.9 to 10. Normal platelet count. Creatinine 1.74 on admission, normalized by the time of discharge. Normal electrolytes.   HOSPITAL COURSE: The patient is a 81 year old female with past medical history of Parkinson's dementia, fibromyalgia, chronic back pain, and severe arthritis who presented with sepsis. The patient was admitted to the hospital and blood and urine cultures were sent which showed no significant growth. The patient had a sacral decubitus ulcer which appeared infected. Wound culture grew Enterococcus and Enterobacter. The patient was treated with empiric antibiotics. A surgical consultation was also obtained and the patient underwent debridement on 12/10/2011 with Wound VAC placement. The patient has been also advised to follow-up with  wound care. In addition, she had right heel decubitus ulcer. A Palliative Care consultation was also obtained and the patient is a DO NOT RESUSCITATE. The patient has dementia which has been progressive for several months, was diagnosed with Parkinson's by her PCP and has a follow-up appointment with a neurologist. The patient also has dysphagia which was progressively getting worse. She was evaluated by speech therapist during the hospitalization and was started on a pureed diet. She had some relative hypotension and that was felt to be due to poor oral intake, dehydration, and infection. Her acute renal failure and hypotension have resolved. The patient is being discharged home in a stable condition.   TIME SPENT: 45 minutes.  ____________________________ Darrick MeigsSangeeta Annella Prowell, MD sp:drc D: 12/12/2011 17:02:18 ET T: 12/13/2011 10:47:51 ET JOB#: [id number]  cc: Darrick MeigsSangeeta Delanee Xin, MD, <Dictator> Darrick MeigsSANGEETA Maquita Sandoval MD ELECTRONICALLY SIGNED 12/14/2011 8:33
